# Patient Record
Sex: Male | Born: 1995 | Race: White | Hispanic: No | Marital: Single
Health system: Southern US, Community
[De-identification: ages and names within clinical notes are randomized; demographics above are authoritative.]

---

## 2016-07-21 ENCOUNTER — Emergency Department: Payer: Managed Care, Other (non HMO)

## 2016-07-21 ENCOUNTER — Emergency Department
Admission: EM | Admit: 2016-07-21 | Discharge: 2016-07-21 | Disposition: A | Discharge status: Home / Self Care | Payer: Managed Care, Other (non HMO) | Attending: Emergency Medicine | Admitting: Emergency Medicine

## 2016-07-21 ENCOUNTER — Encounter: Payer: Self-pay | Admitting: *Deleted

## 2016-07-21 DIAGNOSIS — M25562 Pain in left knee: Secondary | ICD-10-CM | POA: Diagnosis not present

## 2016-07-21 DIAGNOSIS — M25561 Pain in right knee: Secondary | ICD-10-CM

## 2016-07-21 DIAGNOSIS — Y92410 Unspecified street and highway as the place of occurrence of the external cause: Secondary | ICD-10-CM | POA: Diagnosis not present

## 2016-07-21 DIAGNOSIS — Y9389 Activity, other specified: Secondary | ICD-10-CM | POA: Diagnosis not present

## 2016-07-21 DIAGNOSIS — S39012A Strain of muscle, fascia and tendon of lower back, initial encounter: Secondary | ICD-10-CM | POA: Diagnosis not present

## 2016-07-21 DIAGNOSIS — Y999 Unspecified external cause status: Secondary | ICD-10-CM | POA: Insufficient documentation

## 2016-07-21 DIAGNOSIS — S3992XA Unspecified injury of lower back, initial encounter: Secondary | ICD-10-CM | POA: Diagnosis present

## 2016-07-21 MED ORDER — METHOCARBAMOL 500 MG PO TABS: 500.0000 mg | ORAL_TABLET | Freq: Four times a day (QID) | ORAL | 0 refills | Status: AC

## 2016-07-21 MED ORDER — MELOXICAM 15 MG PO TABS: 15.0000 mg | ORAL_TABLET | Freq: Every day | ORAL | 0 refills | Status: AC
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
